# Patient Record
Sex: Female | Born: 1945 | Race: White | Hispanic: No | Marital: Married | Smoking: Never smoker
Health system: Southern US, Community
[De-identification: ages and names within clinical notes are randomized; demographics above are authoritative.]

## PROBLEM LIST (undated history)

## (undated) DIAGNOSIS — E876 Hypokalemia: Secondary | ICD-10-CM

## (undated) DIAGNOSIS — J45909 Unspecified asthma, uncomplicated: Secondary | ICD-10-CM

## (undated) DIAGNOSIS — I10 Essential (primary) hypertension: Secondary | ICD-10-CM

## (undated) HISTORY — PX: LEG SURGERY: SHX1003

## (undated) HISTORY — DX: Unspecified asthma, uncomplicated: J45.909

## (undated) HISTORY — DX: Essential (primary) hypertension: I10

## (undated) HISTORY — DX: Hypokalemia: E87.6

## (undated) HISTORY — PX: HYSTEROTOMY: SHX1776

## (undated) HISTORY — PX: TONSILLECTOMY: SUR1361

## (undated) HISTORY — PX: OVARIAN CYST REMOVAL: SHX89

## (undated) HISTORY — PX: CHOLECYSTECTOMY: SHX55

---

## 1998-08-05 ENCOUNTER — Other Ambulatory Visit: Admission: RE | Admit: 1998-08-05 | Discharge: 1998-08-05 | Payer: Self-pay | Admitting: Gynecology

## 1998-11-04 ENCOUNTER — Encounter: Payer: Self-pay | Admitting: General Surgery

## 1998-11-05 ENCOUNTER — Ambulatory Visit (HOSPITAL_COMMUNITY): Admission: RE | Admit: 1998-11-05 | Discharge: 1998-11-06 | Payer: Self-pay | Admitting: General Surgery

## 1998-11-05 ENCOUNTER — Encounter: Payer: Self-pay | Admitting: General Surgery

## 1999-02-08 ENCOUNTER — Encounter: Payer: Self-pay | Admitting: Emergency Medicine

## 1999-02-08 ENCOUNTER — Emergency Department (HOSPITAL_COMMUNITY): Admission: EM | Admit: 1999-02-08 | Discharge: 1999-02-08 | Payer: Self-pay | Admitting: Emergency Medicine

## 1999-09-24 ENCOUNTER — Encounter: Admission: RE | Admit: 1999-09-24 | Discharge: 1999-09-24 | Payer: Self-pay | Admitting: Gynecology

## 1999-09-24 ENCOUNTER — Encounter: Payer: Self-pay | Admitting: Gynecology

## 2000-06-30 ENCOUNTER — Other Ambulatory Visit: Admission: RE | Admit: 2000-06-30 | Discharge: 2000-06-30 | Payer: Self-pay | Admitting: Gynecology

## 2000-07-10 ENCOUNTER — Encounter: Payer: Self-pay | Admitting: Gynecology

## 2000-07-10 ENCOUNTER — Encounter: Admission: RE | Admit: 2000-07-10 | Discharge: 2000-07-10 | Payer: Self-pay | Admitting: Gynecology

## 2000-09-27 ENCOUNTER — Encounter: Admission: RE | Admit: 2000-09-27 | Discharge: 2000-09-27 | Payer: Self-pay | Admitting: Gynecology

## 2000-09-27 ENCOUNTER — Encounter: Payer: Self-pay | Admitting: Gynecology

## 2001-03-08 ENCOUNTER — Encounter: Payer: Self-pay | Admitting: Emergency Medicine

## 2001-03-08 ENCOUNTER — Emergency Department (HOSPITAL_COMMUNITY): Admission: EM | Admit: 2001-03-08 | Discharge: 2001-03-08 | Payer: Self-pay | Admitting: Emergency Medicine

## 2001-04-03 ENCOUNTER — Encounter: Payer: Self-pay | Admitting: Emergency Medicine

## 2001-04-03 ENCOUNTER — Emergency Department (HOSPITAL_COMMUNITY): Admission: EM | Admit: 2001-04-03 | Discharge: 2001-04-03 | Payer: Self-pay | Admitting: Emergency Medicine

## 2001-10-18 ENCOUNTER — Encounter: Admission: RE | Admit: 2001-10-18 | Discharge: 2001-10-18 | Payer: Self-pay | Admitting: Gynecology

## 2001-10-18 ENCOUNTER — Encounter: Payer: Self-pay | Admitting: Gynecology

## 2002-12-24 ENCOUNTER — Encounter: Payer: Self-pay | Admitting: Gynecology

## 2002-12-24 ENCOUNTER — Encounter: Admission: RE | Admit: 2002-12-24 | Discharge: 2002-12-24 | Payer: Self-pay | Admitting: Gynecology

## 2003-06-12 ENCOUNTER — Emergency Department (HOSPITAL_COMMUNITY): Admission: AD | Admit: 2003-06-12 | Discharge: 2003-06-13 | Payer: Self-pay | Admitting: Emergency Medicine

## 2004-03-02 ENCOUNTER — Encounter: Admission: RE | Admit: 2004-03-02 | Discharge: 2004-03-02 | Payer: Self-pay | Admitting: Gynecology

## 2004-04-16 ENCOUNTER — Other Ambulatory Visit: Admission: RE | Admit: 2004-04-16 | Discharge: 2004-04-16 | Payer: Self-pay | Admitting: Gynecology

## 2005-09-02 ENCOUNTER — Encounter: Admission: RE | Admit: 2005-09-02 | Discharge: 2005-09-02 | Payer: Self-pay | Admitting: Gynecology

## 2005-09-08 ENCOUNTER — Other Ambulatory Visit: Admission: RE | Admit: 2005-09-08 | Discharge: 2005-09-08 | Payer: Self-pay | Admitting: Gynecology

## 2006-11-21 ENCOUNTER — Emergency Department (HOSPITAL_COMMUNITY): Admission: EM | Admit: 2006-11-21 | Discharge: 2006-11-21 | Payer: Self-pay | Admitting: Emergency Medicine

## 2007-01-18 ENCOUNTER — Encounter: Admission: RE | Admit: 2007-01-18 | Discharge: 2007-01-18 | Payer: Self-pay | Admitting: Gynecology

## 2007-01-25 ENCOUNTER — Other Ambulatory Visit: Admission: RE | Admit: 2007-01-25 | Discharge: 2007-01-25 | Payer: Self-pay | Admitting: Gynecology

## 2008-02-13 ENCOUNTER — Encounter: Admission: RE | Admit: 2008-02-13 | Discharge: 2008-02-13 | Payer: Self-pay | Admitting: Gynecology

## 2009-03-17 ENCOUNTER — Encounter: Admission: RE | Admit: 2009-03-17 | Discharge: 2009-03-17 | Payer: Self-pay | Admitting: Gynecology

## 2010-04-09 ENCOUNTER — Encounter: Admission: RE | Admit: 2010-04-09 | Discharge: 2010-04-09 | Payer: Self-pay | Admitting: Gynecology

## 2010-05-30 ENCOUNTER — Encounter: Payer: Self-pay | Admitting: Gynecology

## 2010-09-01 ENCOUNTER — Other Ambulatory Visit: Payer: Self-pay | Admitting: Gynecology

## 2010-12-22 ENCOUNTER — Emergency Department (HOSPITAL_COMMUNITY)
Admission: EM | Admit: 2010-12-22 | Discharge: 2010-12-22 | Disposition: A | Discharge status: Home / Self Care | Payer: 59 | Attending: Emergency Medicine | Admitting: Emergency Medicine

## 2010-12-22 ENCOUNTER — Emergency Department (HOSPITAL_COMMUNITY): Payer: 59

## 2010-12-22 DIAGNOSIS — R42 Dizziness and giddiness: Secondary | ICD-10-CM | POA: Insufficient documentation

## 2010-12-22 DIAGNOSIS — W19XXXA Unspecified fall, initial encounter: Secondary | ICD-10-CM | POA: Insufficient documentation

## 2010-12-22 DIAGNOSIS — R55 Syncope and collapse: Secondary | ICD-10-CM | POA: Insufficient documentation

## 2010-12-22 DIAGNOSIS — K219 Gastro-esophageal reflux disease without esophagitis: Secondary | ICD-10-CM | POA: Insufficient documentation

## 2010-12-22 DIAGNOSIS — E876 Hypokalemia: Secondary | ICD-10-CM | POA: Insufficient documentation

## 2010-12-22 DIAGNOSIS — J45909 Unspecified asthma, uncomplicated: Secondary | ICD-10-CM | POA: Insufficient documentation

## 2010-12-22 DIAGNOSIS — S8010XA Contusion of unspecified lower leg, initial encounter: Secondary | ICD-10-CM | POA: Insufficient documentation

## 2010-12-22 DIAGNOSIS — Y9229 Other specified public building as the place of occurrence of the external cause: Secondary | ICD-10-CM | POA: Insufficient documentation

## 2010-12-22 DIAGNOSIS — I10 Essential (primary) hypertension: Secondary | ICD-10-CM | POA: Insufficient documentation

## 2010-12-22 LAB — DIFFERENTIAL
Basophils Absolute: 0.1 10*3/uL (ref 0.0–0.1)
Basophils Relative: 1 % (ref 0–1)
Eosinophils Absolute: 0.2 10*3/uL (ref 0.0–0.7)
Eosinophils Relative: 3 % (ref 0–5)
Lymphocytes Relative: 23 % (ref 12–46)
Lymphs Abs: 2.1 10*3/uL (ref 0.7–4.0)
Monocytes Absolute: 0.6 10*3/uL (ref 0.1–1.0)
Monocytes Relative: 7 % (ref 3–12)
Neutro Abs: 6.2 10*3/uL (ref 1.7–7.7)
Neutrophils Relative %: 67 % (ref 43–77)

## 2010-12-22 LAB — CBC
HCT: 39.8 % (ref 36.0–46.0)
Hemoglobin: 13.8 g/dL (ref 12.0–15.0)
MCH: 29.4 pg (ref 26.0–34.0)
MCHC: 34.7 g/dL (ref 30.0–36.0)
MCV: 84.9 fL (ref 78.0–100.0)
Platelets: 308 10*3/uL (ref 150–400)
RBC: 4.69 MIL/uL (ref 3.87–5.11)
RDW: 14.1 % (ref 11.5–15.5)
WBC: 9.2 10*3/uL (ref 4.0–10.5)

## 2010-12-22 LAB — POCT I-STAT, CHEM 8
BUN: 18 mg/dL (ref 6–23)
Calcium, Ion: 1.17 mmol/L (ref 1.12–1.32)
Chloride: 98 mEq/L (ref 96–112)
Creatinine, Ser: 1.3 mg/dL — ABNORMAL HIGH (ref 0.50–1.10)
Glucose, Bld: 109 mg/dL — ABNORMAL HIGH (ref 70–99)
HCT: 42 % (ref 36.0–46.0)
Hemoglobin: 14.3 g/dL (ref 12.0–15.0)
Potassium: 2.9 mEq/L — ABNORMAL LOW (ref 3.5–5.1)
Sodium: 137 mEq/L (ref 135–145)
TCO2: 28 mmol/L (ref 0–100)

## 2010-12-22 LAB — URINALYSIS, ROUTINE W REFLEX MICROSCOPIC
Bilirubin Urine: NEGATIVE
Glucose, UA: NEGATIVE mg/dL
Hgb urine dipstick: NEGATIVE
Ketones, ur: NEGATIVE mg/dL
Nitrite: NEGATIVE
Protein, ur: NEGATIVE mg/dL
Specific Gravity, Urine: 1.012 (ref 1.005–1.030)
Urobilinogen, UA: 0.2 mg/dL (ref 0.0–1.0)
pH: 7.5 (ref 5.0–8.0)

## 2010-12-22 LAB — URINE MICROSCOPIC-ADD ON

## 2010-12-22 LAB — POCT I-STAT TROPONIN I: Troponin i, poc: 0 ng/mL (ref 0.00–0.08)

## 2010-12-22 LAB — ETHANOL: Alcohol, Ethyl (B): <11 mg/dL (ref 0–11)

## 2010-12-22 LAB — POTASSIUM: Potassium: 3.2 mEq/L — ABNORMAL LOW (ref 3.5–5.1)

## 2011-02-21 LAB — POCT CARDIAC MARKERS
CKMB, poc: 1.1
CKMB, poc: <1 — ABNORMAL LOW
Myoglobin, poc: 85.6
Myoglobin, poc: 96.9
Operator id: 234501
Operator id: 234501
Troponin i, poc: <0.05
Troponin i, poc: <0.05

## 2011-02-21 LAB — I-STAT 8, (EC8 V) (CONVERTED LAB)
BUN: 12
Bicarbonate: 25.9 — ABNORMAL HIGH
Chloride: 101
Glucose, Bld: 93
HCT: 42
Hemoglobin: 14.3
Operator id: 234501
Potassium: 3.5
Sodium: 134 — ABNORMAL LOW
TCO2: 27
pCO2, Ven: 43.9 — ABNORMAL LOW
pH, Ven: 7.38 — ABNORMAL HIGH

## 2011-02-21 LAB — POCT I-STAT CREATININE
Creatinine, Ser: 1.2
Operator id: 234501

## 2011-02-21 LAB — D-DIMER, QUANTITATIVE: D-Dimer, Quant: 0.31

## 2011-05-19 ENCOUNTER — Other Ambulatory Visit: Payer: Self-pay | Admitting: Gynecology

## 2011-05-19 DIAGNOSIS — Z1231 Encounter for screening mammogram for malignant neoplasm of breast: Secondary | ICD-10-CM

## 2011-05-20 ENCOUNTER — Ambulatory Visit: Payer: 59

## 2011-05-26 ENCOUNTER — Ambulatory Visit: Payer: 59

## 2011-05-31 ENCOUNTER — Ambulatory Visit
Admission: RE | Admit: 2011-05-31 | Discharge: 2011-05-31 | Disposition: A | Discharge status: Home / Self Care | Payer: 59 | Source: Ambulatory Visit | Attending: Gynecology | Admitting: Gynecology

## 2011-05-31 DIAGNOSIS — Z1231 Encounter for screening mammogram for malignant neoplasm of breast: Secondary | ICD-10-CM

## 2011-06-02 ENCOUNTER — Ambulatory Visit: Payer: 59

## 2012-06-11 ENCOUNTER — Other Ambulatory Visit: Payer: Self-pay | Admitting: Gynecology

## 2012-06-11 DIAGNOSIS — Z1231 Encounter for screening mammogram for malignant neoplasm of breast: Secondary | ICD-10-CM

## 2012-06-12 ENCOUNTER — Ambulatory Visit: Payer: 59

## 2012-07-23 ENCOUNTER — Ambulatory Visit: Payer: 59

## 2012-07-25 ENCOUNTER — Ambulatory Visit: Payer: 59

## 2012-07-31 ENCOUNTER — Ambulatory Visit
Admission: RE | Admit: 2012-07-31 | Discharge: 2012-07-31 | Disposition: A | Discharge status: Home / Self Care | Payer: 59 | Source: Ambulatory Visit | Attending: Gynecology | Admitting: Gynecology

## 2012-07-31 DIAGNOSIS — Z1231 Encounter for screening mammogram for malignant neoplasm of breast: Secondary | ICD-10-CM

## 2012-08-02 ENCOUNTER — Ambulatory Visit: Payer: 59

## 2013-08-07 ENCOUNTER — Other Ambulatory Visit: Payer: Self-pay

## 2013-08-07 DIAGNOSIS — Z1231 Encounter for screening mammogram for malignant neoplasm of breast: Secondary | ICD-10-CM

## 2013-08-08 ENCOUNTER — Ambulatory Visit
Admission: RE | Admit: 2013-08-08 | Discharge: 2013-08-08 | Disposition: A | Discharge status: Home / Self Care | Payer: Medicare Other | Source: Ambulatory Visit

## 2013-08-08 DIAGNOSIS — Z1231 Encounter for screening mammogram for malignant neoplasm of breast: Secondary | ICD-10-CM

## 2013-08-13 ENCOUNTER — Other Ambulatory Visit: Payer: Self-pay | Admitting: Gynecology

## 2013-08-13 DIAGNOSIS — R928 Other abnormal and inconclusive findings on diagnostic imaging of breast: Secondary | ICD-10-CM

## 2013-08-15 ENCOUNTER — Ambulatory Visit
Admission: RE | Admit: 2013-08-15 | Discharge: 2013-08-15 | Disposition: A | Discharge status: Home / Self Care | Payer: Medicare Other | Source: Ambulatory Visit | Attending: Gynecology | Admitting: Gynecology

## 2013-08-15 ENCOUNTER — Ambulatory Visit
Admission: RE | Admit: 2013-08-15 | Discharge: 2013-08-15 | Disposition: A | Discharge status: Home / Self Care | Payer: Self-pay | Source: Ambulatory Visit | Attending: Gynecology | Admitting: Gynecology

## 2013-08-15 DIAGNOSIS — R928 Other abnormal and inconclusive findings on diagnostic imaging of breast: Secondary | ICD-10-CM

## 2013-08-21 ENCOUNTER — Other Ambulatory Visit: Payer: Medicare Other

## 2013-08-23 ENCOUNTER — Other Ambulatory Visit: Payer: Medicare Other

## 2013-09-06 ENCOUNTER — Ambulatory Visit: Payer: Self-pay | Admitting: Gynecology

## 2013-09-11 ENCOUNTER — Ambulatory Visit: Payer: Self-pay | Admitting: Gynecology

## 2013-10-14 ENCOUNTER — Ambulatory Visit: Payer: Self-pay | Admitting: Gynecology

## 2014-02-11 ENCOUNTER — Ambulatory Visit: Payer: Self-pay | Admitting: Gynecology

## 2014-04-08 ENCOUNTER — Ambulatory Visit: Payer: Self-pay | Admitting: Gynecology

## 2014-04-15 ENCOUNTER — Ambulatory Visit: Payer: Self-pay | Admitting: Gynecology

## 2014-05-16 ENCOUNTER — Ambulatory Visit: Payer: Self-pay | Admitting: Gynecology

## 2014-07-02 ENCOUNTER — Ambulatory Visit: Payer: Self-pay | Admitting: Gynecology

## 2014-10-01 ENCOUNTER — Other Ambulatory Visit: Payer: Self-pay

## 2014-10-01 DIAGNOSIS — Z1231 Encounter for screening mammogram for malignant neoplasm of breast: Secondary | ICD-10-CM

## 2014-10-29 ENCOUNTER — Ambulatory Visit
Admission: RE | Admit: 2014-10-29 | Discharge: 2014-10-29 | Disposition: A | Discharge status: Home / Self Care | Payer: Medicare Other | Source: Ambulatory Visit

## 2014-10-29 DIAGNOSIS — Z1231 Encounter for screening mammogram for malignant neoplasm of breast: Secondary | ICD-10-CM

## 2015-01-14 ENCOUNTER — Other Ambulatory Visit (HOSPITAL_COMMUNITY)
Admission: RE | Admit: 2015-01-14 | Discharge: 2015-01-14 | Disposition: A | Discharge status: Home / Self Care | Payer: Medicare Other | Source: Ambulatory Visit | Attending: Gynecology | Admitting: Gynecology

## 2015-01-14 ENCOUNTER — Ambulatory Visit (INDEPENDENT_AMBULATORY_CARE_PROVIDER_SITE_OTHER): Payer: Medicare Other | Admitting: Gynecology

## 2015-01-14 ENCOUNTER — Encounter: Payer: Self-pay | Admitting: Gynecology

## 2015-01-14 VITALS — BP 142/80 | Ht 63.0 in | Wt 156.0 lb

## 2015-01-14 DIAGNOSIS — N952 Postmenopausal atrophic vaginitis: Secondary | ICD-10-CM | POA: Diagnosis not present

## 2015-01-14 DIAGNOSIS — M810 Age-related osteoporosis without current pathological fracture: Secondary | ICD-10-CM

## 2015-01-14 DIAGNOSIS — Z124 Encounter for screening for malignant neoplasm of cervix: Secondary | ICD-10-CM | POA: Diagnosis present

## 2015-01-14 DIAGNOSIS — Z01419 Encounter for gynecological examination (general) (routine) without abnormal findings: Secondary | ICD-10-CM

## 2015-01-14 MED ORDER — ALENDRONATE SODIUM 70 MG PO TABS: 70.0000 mg | ORAL_TABLET | ORAL | Status: AC

## 2015-01-14 NOTE — Patient Instructions (Signed)
Start on the Fosamax as we discussed. Call me if you have any issues with this.  You may obtain a copy of any labs that were done today by logging onto MyChart as outlined in the instructions provided with your AVS (after visit summary). The office will not call with normal lab results but certainly if there are any significant abnormalities then we will contact you.   Health Maintenance Adopting a healthy lifestyle and getting preventive care can go a long way to promote health and wellness. Talk with your health care provider about what schedule of regular examinations is right for you. This is a good chance for you to check in with your provider about disease prevention and staying healthy. In between checkups, there are plenty of things you can do on your own. Experts have done a lot of research about which lifestyle changes and preventive measures are most likely to keep you healthy. Ask your health care provider for more information. WEIGHT AND DIET  Eat a healthy diet  Be sure to include plenty of vegetables, fruits, low-fat dairy products, and lean protein.  Do not eat a lot of foods high in solid fats, added sugars, or salt.  Get regular exercise. This is one of the most important things you can do for your health.  Most adults should exercise for at least 150 minutes each week. The exercise should increase your heart rate and make you sweat (moderate-intensity exercise).  Most adults should also do strengthening exercises at least twice a week. This is in addition to the moderate-intensity exercise.  Maintain a healthy weight  Body mass index (BMI) is a measurement that can be used to identify possible weight problems. It estimates body fat based on height and weight. Your health care provider can help determine your BMI and help you achieve or maintain a healthy weight.  For females 76 years of age and older:   A BMI below 18.5 is considered underweight.  A BMI of 18.5 to 24.9  is normal.  A BMI of 25 to 29.9 is considered overweight.  A BMI of 30 and above is considered obese.  Watch levels of cholesterol and blood lipids  You should start having your blood tested for lipids and cholesterol at 67 years of age, then have this test every 5 years.  You may need to have your cholesterol levels checked more often if:  Your lipid or cholesterol levels are high.  You are older than 67 years of age.  You are at high risk for heart disease.  CANCER SCREENING   Lung Cancer  Lung cancer screening is recommended for adults 52-78 years old who are at high risk for lung cancer because of a history of smoking.  A yearly low-dose CT scan of the lungs is recommended for people who:  Currently smoke.  Have quit within the past 15 years.  Have at least a 30-pack-year history of smoking. A pack year is smoking an average of one pack of cigarettes a day for 1 year.  Yearly screening should continue until it has been 15 years since you quit.  Yearly screening should stop if you develop a health problem that would prevent you from having lung cancer treatment.  Breast Cancer  Practice breast self-awareness. This means understanding how your breasts normally appear and feel.  It also means doing regular breast self-exams. Let your health care provider know about any changes, no matter how small.  If you are in your 72s  or 65s, you should have a clinical breast exam (CBE) by a health care provider every 1-3 years as part of a regular health exam.  If you are 88 or older, have a CBE every year. Also consider having a breast X-ray (mammogram) every year.  If you have a family history of breast cancer, talk to your health care provider about genetic screening.  If you are at high risk for breast cancer, talk to your health care provider about having an MRI and a mammogram every year.  Breast cancer gene (BRCA) assessment is recommended for women who have family  members with BRCA-related cancers. BRCA-related cancers include:  Breast.  Ovarian.  Tubal.  Peritoneal cancers.  Results of the assessment will determine the need for genetic counseling and BRCA1 and BRCA2 testing. Cervical Cancer Routine pelvic examinations to screen for cervical cancer are no longer recommended for nonpregnant women who are considered low risk for cancer of the pelvic organs (ovaries, uterus, and vagina) and who do not have symptoms. A pelvic examination may be necessary if you have symptoms including those associated with pelvic infections. Ask your health care provider if a screening pelvic exam is right for you.   The Pap test is the screening test for cervical cancer for women who are considered at risk.  If you had a hysterectomy for a problem that was not cancer or a condition that could lead to cancer, then you no longer need Pap tests.  If you are older than 65 years, and you have had normal Pap tests for the past 10 years, you no longer need to have Pap tests.  If you have had past treatment for cervical cancer or a condition that could lead to cancer, you need Pap tests and screening for cancer for at least 20 years after your treatment.  If you no longer get a Pap test, assess your risk factors if they change (such as having a new sexual partner). This can affect whether you should start being screened again.  Some women have medical problems that increase their chance of getting cervical cancer. If this is the case for you, your health care provider may recommend more frequent screening and Pap tests.  The human papillomavirus (HPV) test is another test that may be used for cervical cancer screening. The HPV test looks for the virus that can cause cell changes in the cervix. The cells collected during the Pap test can be tested for HPV.  The HPV test can be used to screen women 72 years of age and older. Getting tested for HPV can extend the interval  between normal Pap tests from three to five years.  An HPV test also should be used to screen women of any age who have unclear Pap test results.  After 67 years of age, women should have HPV testing as often as Pap tests.  Colorectal Cancer  This type of cancer can be detected and often prevented.  Routine colorectal cancer screening usually begins at 67 years of age and continues through 67 years of age.  Your health care provider may recommend screening at an earlier age if you have risk factors for colon cancer.  Your health care provider may also recommend using home test kits to check for hidden blood in the stool.  A small camera at the end of a tube can be used to examine your colon directly (sigmoidoscopy or colonoscopy). This is done to check for the earliest forms of colorectal cancer.  Routine screening usually begins at age 63.  Direct examination of the colon should be repeated every 5-10 years through 67 years of age. However, you may need to be screened more often if early forms of precancerous polyps or small growths are found. Skin Cancer  Check your skin from head to toe regularly.  Tell your health care provider about any new moles or changes in moles, especially if there is a change in a mole's shape or color.  Also tell your health care provider if you have a mole that is larger than the size of a pencil eraser.  Always use sunscreen. Apply sunscreen liberally and repeatedly throughout the day.  Protect yourself by wearing long sleeves, pants, a wide-brimmed hat, and sunglasses whenever you are outside. HEART DISEASE, DIABETES, AND HIGH BLOOD PRESSURE   Have your blood pressure checked at least every 1-2 years. High blood pressure causes heart disease and increases the risk of stroke.  If you are between 19 years and 40 years old, ask your health care provider if you should take aspirin to prevent strokes.  Have regular diabetes screenings. This involves  taking a blood sample to check your fasting blood sugar level.  If you are at a normal weight and have a low risk for diabetes, have this test once every three years after 67 years of age.  If you are overweight and have a high risk for diabetes, consider being tested at a younger age or more often. PREVENTING INFECTION  Hepatitis B  If you have a higher risk for hepatitis B, you should be screened for this virus. You are considered at high risk for hepatitis B if:  You were born in a country where hepatitis B is common. Ask your health care provider which countries are considered high risk.  Your parents were born in a high-risk country, and you have not been immunized against hepatitis B (hepatitis B vaccine).  You have HIV or AIDS.  You use needles to inject street drugs.  You live with someone who has hepatitis B.  You have had sex with someone who has hepatitis B.  You get hemodialysis treatment.  You take certain medicines for conditions, including cancer, organ transplantation, and autoimmune conditions. Hepatitis C  Blood testing is recommended for:  Everyone born from [birth year] through 1963.  Anyone with known risk factors for hepatitis C. Sexually transmitted infections (STIs)  You should be screened for sexually transmitted infections (STIs) including gonorrhea and chlamydia if:  You are sexually active and are younger than 66 years of age.  You are older than 67 years of age and your health care provider tells you that you are at risk for this type of infection.  Your sexual activity has changed since you were last screened and you are at an increased risk for chlamydia or gonorrhea. Ask your health care provider if you are at risk.  If you do not have HIV, but are at risk, it may be recommended that you take a prescription medicine daily to prevent HIV infection. This is called pre-exposure prophylaxis (PrEP). You are considered at risk if:  You are sexually active  and do not regularly use condoms or know the HIV status of your partner(s).  You take drugs by injection.  You are sexually active with a partner who has HIV. Talk with your health care provider about whether you are at high risk of being infected with HIV. If you choose to begin PrEP, you should first  be tested for HIV. You should then be tested every 3 months for as long as you are taking PrEP.  PREGNANCY   If you are premenopausal and you may become pregnant, ask your health care provider about preconception counseling.  If you may become pregnant, take 400 to 800 micrograms (mcg) of folic acid every day.  If you want to prevent pregnancy, talk to your health care provider about birth control (contraception). OSTEOPOROSIS AND MENOPAUSE   Osteoporosis is a disease in which the bones lose minerals and strength with aging. This can result in serious bone fractures. Your risk for osteoporosis can be identified using a bone density scan.  If you are 6 years of age or older, or if you are at risk for osteoporosis and fractures, ask your health care provider if you should be screened.  Ask your health care provider whether you should take a calcium or vitamin D supplement to lower your risk for osteoporosis.  Menopause may have certain physical symptoms and risks.  Hormone replacement therapy may reduce some of these symptoms and risks. Talk to your health care provider about whether hormone replacement therapy is right for you.  HOME CARE INSTRUCTIONS   Schedule regular health, dental, and eye exams.  Stay current with your immunizations.   Do not use any tobacco products including cigarettes, chewing tobacco, or electronic cigarettes.  If you are pregnant, do not drink alcohol.  If you are breastfeeding, limit how much and how often you drink alcohol.  Limit alcohol intake to no more than 1 drink per day for nonpregnant women. One drink equals 12 ounces of beer, 5 ounces of wine,  or 1 ounces of hard liquor.  Do not use street drugs.  Do not share needles.  Ask your health care provider for help if you need support or information about quitting drugs.  Tell your health care provider if you often feel depressed.  Tell your health care provider if you have ever been abused or do not feel safe at home. Document Released: 11/08/2010 Document Revised: 09/09/2013 Document Reviewed: 03/27/2013 [facility] Patient Information 2015 [place], [place]. This information is not intended to replace advice given to you by your health care provider. Make sure you discuss any questions you have with your health care provider.

## 2015-01-14 NOTE — Progress Notes (Signed)
Emily Sparks 12/09/1945 [id number]        69 y.o.  G2P0002 new patient for breast and pelvic exam. Former patient of Dr. Nicholas Lose. Several issues noted below.  Past medical history,surgical history, problem list, medications, allergies, family history and social history were all reviewed and documented as reviewed in the EPIC chart.  ROS:  Performed with pertinent positives and negatives included in the history, assessment and plan.   Additional significant findings :  none   Exam: [name] Vitals:   01/14/15 1426  BP: 142/80  Height:  (1.6 m)  Weight: 156 lb (70.761 kg)   General appearance:  Normal affect, orientation and appearance. Skin: Grossly normal HEENT: Without gross lesions.  No cervical or supraclavicular adenopathy. Thyroid normal.  Lungs:  With bilateral wheezing. No rales or rhonchi Cardiac: RR, without RMG Abdominal:  Soft, nontender, without masses, guarding, rebound, organomegaly or hernia Breasts:  Examined lying and sitting without masses, retractions, discharge or axillary adenopathy. Pelvic:  Ext/BUS/vagina with atrophic changes. Pap smear of cuff done  Adnexa  Without masses or tenderness    Anus and perineum  Normal   Rectovaginal  Normal sphincter tone without palpated masses or tenderness.    Assessment/Plan:  69 y.o. G8P0002 female for breast and pelvic exam.   1. Postmenopausal/atrophic genital changes. 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 Transiently used ERT for approximately 5 years and discontinued. Doing well without significant hot flushes, night sweats or vaginal dryness. Continue to monitor. 2. Osteoporosis. DEXA from 2002 shows T score -3.1. Apparently has never been treated medication wise and had declined treatment in the past. Uses intermittent steroids for her asthma. Reviewed her significant risk of fracture noting she did break her tibia stepping off a curb in the past. I reviewed my strong  recommendation she consider treatment now and options were reviewed with her. After a lengthy discussion she does want a trial of Fosamax 70 mg weekly. I reviewed how to take the medicines and the side effects/risks to include GERD osteonecrosis of the jaw atypical fractures. Prescription 1 year provided she is going to go ahead and start this. Baseline bone density ordered and she is going to schedule this now so we'll have something to compare to. 3. Mammography June 2016. Continue with annual mammography when due. SBE monthly reviewed. 4. Pap smear 2012. Pap smear of vaginal cuff today. No history of abnormal Pap smears previously. Options to stop screening on a regular basis given age and hysterectomy for benign indications discussed. Will readdress on an annual basis. 5. Colonoscopy 2013 by Dr. Kinnie Scales. Repeat at their recommended interval. 6. Health maintenance. Blood pressure 142/80 noted. Is being treated for hypertension and reports better blood pressures normally. No routine blood work done as this is done at her primary physician's office. Follow up in one year, sooner as needed.   Dara Lords MD, 3:13 PM 01/14/2015

## 2015-01-16 LAB — CYTOLOGY - PAP

## 2015-11-03 ENCOUNTER — Other Ambulatory Visit: Payer: Self-pay | Admitting: Internal Medicine

## 2015-11-03 DIAGNOSIS — Z1231 Encounter for screening mammogram for malignant neoplasm of breast: Secondary | ICD-10-CM

## 2015-11-17 ENCOUNTER — Ambulatory Visit
Admission: RE | Admit: 2015-11-17 | Discharge: 2015-11-17 | Disposition: A | Discharge status: Home / Self Care | Payer: Medicare Other | Source: Ambulatory Visit | Attending: Internal Medicine | Admitting: Internal Medicine

## 2015-11-17 DIAGNOSIS — Z1231 Encounter for screening mammogram for malignant neoplasm of breast: Secondary | ICD-10-CM

## 2016-02-23 DIAGNOSIS — K219 Gastro-esophageal reflux disease without esophagitis: Secondary | ICD-10-CM

## 2016-02-23 DIAGNOSIS — N289 Disorder of kidney and ureter, unspecified: Secondary | ICD-10-CM

## 2016-02-23 DIAGNOSIS — I10 Essential (primary) hypertension: Secondary | ICD-10-CM

## 2016-02-23 DIAGNOSIS — J40 Bronchitis, not specified as acute or chronic: Secondary | ICD-10-CM

## 2016-02-23 DIAGNOSIS — R0689 Other abnormalities of breathing: Secondary | ICD-10-CM

## 2016-02-23 DIAGNOSIS — J45902 Unspecified asthma with status asthmaticus: Secondary | ICD-10-CM | POA: Diagnosis not present

## 2016-02-23 DIAGNOSIS — J45901 Unspecified asthma with (acute) exacerbation: Secondary | ICD-10-CM

## 2016-02-24 DIAGNOSIS — J45902 Unspecified asthma with status asthmaticus: Secondary | ICD-10-CM | POA: Diagnosis not present

## 2016-02-24 DIAGNOSIS — J40 Bronchitis, not specified as acute or chronic: Secondary | ICD-10-CM | POA: Diagnosis not present

## 2016-02-24 DIAGNOSIS — R0689 Other abnormalities of breathing: Secondary | ICD-10-CM | POA: Diagnosis not present

## 2016-02-24 DIAGNOSIS — J45901 Unspecified asthma with (acute) exacerbation: Secondary | ICD-10-CM | POA: Diagnosis not present

## 2016-06-21 DIAGNOSIS — E871 Hypo-osmolality and hyponatremia: Secondary | ICD-10-CM

## 2016-06-21 DIAGNOSIS — J45901 Unspecified asthma with (acute) exacerbation: Secondary | ICD-10-CM

## 2016-06-21 DIAGNOSIS — E876 Hypokalemia: Secondary | ICD-10-CM | POA: Diagnosis not present

## 2016-06-21 DIAGNOSIS — R0689 Other abnormalities of breathing: Secondary | ICD-10-CM

## 2016-06-21 DIAGNOSIS — N179 Acute kidney failure, unspecified: Secondary | ICD-10-CM | POA: Diagnosis not present

## 2016-06-22 DIAGNOSIS — J45901 Unspecified asthma with (acute) exacerbation: Secondary | ICD-10-CM | POA: Diagnosis not present

## 2016-06-22 DIAGNOSIS — N179 Acute kidney failure, unspecified: Secondary | ICD-10-CM | POA: Diagnosis not present

## 2016-06-22 DIAGNOSIS — E876 Hypokalemia: Secondary | ICD-10-CM | POA: Diagnosis not present

## 2016-06-22 DIAGNOSIS — R0689 Other abnormalities of breathing: Secondary | ICD-10-CM | POA: Diagnosis not present

## 2016-06-23 DIAGNOSIS — R0689 Other abnormalities of breathing: Secondary | ICD-10-CM | POA: Diagnosis not present

## 2016-06-23 DIAGNOSIS — N179 Acute kidney failure, unspecified: Secondary | ICD-10-CM | POA: Diagnosis not present

## 2016-06-23 DIAGNOSIS — J45901 Unspecified asthma with (acute) exacerbation: Secondary | ICD-10-CM | POA: Diagnosis not present

## 2016-06-23 DIAGNOSIS — E876 Hypokalemia: Secondary | ICD-10-CM | POA: Diagnosis not present

## 2016-06-24 DIAGNOSIS — N179 Acute kidney failure, unspecified: Secondary | ICD-10-CM | POA: Diagnosis not present

## 2016-06-24 DIAGNOSIS — J45901 Unspecified asthma with (acute) exacerbation: Secondary | ICD-10-CM | POA: Diagnosis not present

## 2016-06-24 DIAGNOSIS — R0689 Other abnormalities of breathing: Secondary | ICD-10-CM | POA: Diagnosis not present

## 2016-06-24 DIAGNOSIS — E876 Hypokalemia: Secondary | ICD-10-CM | POA: Diagnosis not present

## 2016-10-13 DIAGNOSIS — I10 Essential (primary) hypertension: Secondary | ICD-10-CM | POA: Diagnosis not present

## 2016-10-13 DIAGNOSIS — J449 Chronic obstructive pulmonary disease, unspecified: Secondary | ICD-10-CM

## 2016-10-13 DIAGNOSIS — E876 Hypokalemia: Secondary | ICD-10-CM

## 2016-10-13 DIAGNOSIS — J440 Chronic obstructive pulmonary disease with acute lower respiratory infection: Secondary | ICD-10-CM | POA: Diagnosis not present

## 2016-10-13 DIAGNOSIS — K219 Gastro-esophageal reflux disease without esophagitis: Secondary | ICD-10-CM | POA: Diagnosis not present

## 2016-10-15 DIAGNOSIS — E876 Hypokalemia: Secondary | ICD-10-CM | POA: Diagnosis not present

## 2016-10-15 DIAGNOSIS — I10 Essential (primary) hypertension: Secondary | ICD-10-CM | POA: Diagnosis not present

## 2016-10-15 DIAGNOSIS — J440 Chronic obstructive pulmonary disease with acute lower respiratory infection: Secondary | ICD-10-CM | POA: Diagnosis not present

## 2016-10-15 DIAGNOSIS — J449 Chronic obstructive pulmonary disease, unspecified: Secondary | ICD-10-CM | POA: Diagnosis not present

## 2016-11-08 DIAGNOSIS — R0781 Pleurodynia: Secondary | ICD-10-CM

## 2016-11-08 DIAGNOSIS — J4541 Moderate persistent asthma with (acute) exacerbation: Secondary | ICD-10-CM

## 2016-11-08 DIAGNOSIS — I10 Essential (primary) hypertension: Secondary | ICD-10-CM

## 2016-11-08 DIAGNOSIS — R0902 Hypoxemia: Secondary | ICD-10-CM | POA: Diagnosis not present

## 2016-11-09 DIAGNOSIS — J4541 Moderate persistent asthma with (acute) exacerbation: Secondary | ICD-10-CM | POA: Diagnosis not present

## 2016-11-09 DIAGNOSIS — I10 Essential (primary) hypertension: Secondary | ICD-10-CM | POA: Diagnosis not present

## 2016-11-09 DIAGNOSIS — R0781 Pleurodynia: Secondary | ICD-10-CM | POA: Diagnosis not present

## 2016-11-09 DIAGNOSIS — R0902 Hypoxemia: Secondary | ICD-10-CM | POA: Diagnosis not present

## 2016-11-10 DIAGNOSIS — I10 Essential (primary) hypertension: Secondary | ICD-10-CM | POA: Diagnosis not present

## 2016-11-10 DIAGNOSIS — R0781 Pleurodynia: Secondary | ICD-10-CM | POA: Diagnosis not present

## 2016-11-10 DIAGNOSIS — R0902 Hypoxemia: Secondary | ICD-10-CM | POA: Diagnosis not present

## 2016-11-10 DIAGNOSIS — J4541 Moderate persistent asthma with (acute) exacerbation: Secondary | ICD-10-CM | POA: Diagnosis not present

## 2016-11-11 ENCOUNTER — Other Ambulatory Visit: Payer: Self-pay | Admitting: Gynecology

## 2016-11-11 DIAGNOSIS — Z1231 Encounter for screening mammogram for malignant neoplasm of breast: Secondary | ICD-10-CM

## 2016-11-17 ENCOUNTER — Ambulatory Visit
Admission: RE | Admit: 2016-11-17 | Discharge: 2016-11-17 | Disposition: A | Discharge status: Home / Self Care | Payer: Medicare Other | Source: Ambulatory Visit | Attending: Gynecology | Admitting: Gynecology

## 2016-11-17 DIAGNOSIS — Z1231 Encounter for screening mammogram for malignant neoplasm of breast: Secondary | ICD-10-CM

## 2016-12-09 ENCOUNTER — Telehealth: Payer: Self-pay | Admitting: *Deleted

## 2016-12-09 NOTE — Telephone Encounter (Signed)
Pt informed with normal mammogram

## 2017-03-08 DIAGNOSIS — J45901 Unspecified asthma with (acute) exacerbation: Secondary | ICD-10-CM

## 2017-03-08 DIAGNOSIS — R0602 Shortness of breath: Secondary | ICD-10-CM

## 2017-03-08 DIAGNOSIS — I10 Essential (primary) hypertension: Secondary | ICD-10-CM | POA: Diagnosis not present

## 2017-03-08 DIAGNOSIS — B9789 Other viral agents as the cause of diseases classified elsewhere: Secondary | ICD-10-CM

## 2017-03-08 DIAGNOSIS — E876 Hypokalemia: Secondary | ICD-10-CM | POA: Diagnosis not present

## 2017-03-08 DIAGNOSIS — J9601 Acute respiratory failure with hypoxia: Secondary | ICD-10-CM

## 2017-03-08 DIAGNOSIS — F419 Anxiety disorder, unspecified: Secondary | ICD-10-CM

## 2017-03-08 DIAGNOSIS — D72829 Elevated white blood cell count, unspecified: Secondary | ICD-10-CM | POA: Diagnosis not present

## 2017-03-09 DIAGNOSIS — F419 Anxiety disorder, unspecified: Secondary | ICD-10-CM | POA: Diagnosis not present

## 2017-03-09 DIAGNOSIS — D72829 Elevated white blood cell count, unspecified: Secondary | ICD-10-CM | POA: Diagnosis not present

## 2017-03-09 DIAGNOSIS — E876 Hypokalemia: Secondary | ICD-10-CM | POA: Diagnosis not present

## 2017-03-09 DIAGNOSIS — I10 Essential (primary) hypertension: Secondary | ICD-10-CM | POA: Diagnosis not present

## 2017-03-09 DIAGNOSIS — B9789 Other viral agents as the cause of diseases classified elsewhere: Secondary | ICD-10-CM | POA: Diagnosis not present

## 2017-03-09 DIAGNOSIS — R0602 Shortness of breath: Secondary | ICD-10-CM | POA: Diagnosis not present

## 2017-03-09 DIAGNOSIS — J9601 Acute respiratory failure with hypoxia: Secondary | ICD-10-CM | POA: Diagnosis not present

## 2017-03-09 DIAGNOSIS — J45901 Unspecified asthma with (acute) exacerbation: Secondary | ICD-10-CM | POA: Diagnosis not present

## 2017-03-10 DIAGNOSIS — J9601 Acute respiratory failure with hypoxia: Secondary | ICD-10-CM | POA: Diagnosis not present

## 2017-03-10 DIAGNOSIS — B9789 Other viral agents as the cause of diseases classified elsewhere: Secondary | ICD-10-CM | POA: Diagnosis not present

## 2017-03-10 DIAGNOSIS — R0602 Shortness of breath: Secondary | ICD-10-CM | POA: Diagnosis not present

## 2017-03-10 DIAGNOSIS — J45901 Unspecified asthma with (acute) exacerbation: Secondary | ICD-10-CM | POA: Diagnosis not present

## 2017-03-11 DIAGNOSIS — J45901 Unspecified asthma with (acute) exacerbation: Secondary | ICD-10-CM | POA: Diagnosis not present

## 2017-03-11 DIAGNOSIS — R0602 Shortness of breath: Secondary | ICD-10-CM | POA: Diagnosis not present

## 2017-03-11 DIAGNOSIS — B9789 Other viral agents as the cause of diseases classified elsewhere: Secondary | ICD-10-CM | POA: Diagnosis not present

## 2017-03-11 DIAGNOSIS — J9601 Acute respiratory failure with hypoxia: Secondary | ICD-10-CM | POA: Diagnosis not present

## 2017-04-05 DIAGNOSIS — J189 Pneumonia, unspecified organism: Secondary | ICD-10-CM

## 2017-04-05 DIAGNOSIS — R918 Other nonspecific abnormal finding of lung field: Secondary | ICD-10-CM

## 2017-04-05 DIAGNOSIS — I10 Essential (primary) hypertension: Secondary | ICD-10-CM | POA: Diagnosis not present

## 2017-04-05 DIAGNOSIS — J9600 Acute respiratory failure, unspecified whether with hypoxia or hypercapnia: Secondary | ICD-10-CM | POA: Diagnosis not present

## 2017-04-05 DIAGNOSIS — J45901 Unspecified asthma with (acute) exacerbation: Secondary | ICD-10-CM

## 2017-04-06 DIAGNOSIS — J189 Pneumonia, unspecified organism: Secondary | ICD-10-CM | POA: Diagnosis not present

## 2017-04-06 DIAGNOSIS — I10 Essential (primary) hypertension: Secondary | ICD-10-CM | POA: Diagnosis not present

## 2017-04-06 DIAGNOSIS — J9600 Acute respiratory failure, unspecified whether with hypoxia or hypercapnia: Secondary | ICD-10-CM | POA: Diagnosis not present

## 2017-04-06 DIAGNOSIS — K219 Gastro-esophageal reflux disease without esophagitis: Secondary | ICD-10-CM

## 2017-04-06 DIAGNOSIS — R918 Other nonspecific abnormal finding of lung field: Secondary | ICD-10-CM | POA: Diagnosis not present

## 2017-04-06 DIAGNOSIS — J45901 Unspecified asthma with (acute) exacerbation: Secondary | ICD-10-CM | POA: Diagnosis not present

## 2017-12-05 ENCOUNTER — Other Ambulatory Visit: Payer: Self-pay | Admitting: Gynecology

## 2017-12-05 DIAGNOSIS — Z1231 Encounter for screening mammogram for malignant neoplasm of breast: Secondary | ICD-10-CM

## 2017-12-27 ENCOUNTER — Ambulatory Visit
Admission: RE | Admit: 2017-12-27 | Discharge: 2017-12-27 | Disposition: A | Discharge status: Home / Self Care | Payer: Medicare Other | Source: Ambulatory Visit | Attending: Gynecology | Admitting: Gynecology

## 2017-12-27 DIAGNOSIS — Z1231 Encounter for screening mammogram for malignant neoplasm of breast: Secondary | ICD-10-CM

## 2017-12-28 ENCOUNTER — Ambulatory Visit: Payer: Medicare Other

## 2018-04-04 DIAGNOSIS — J40 Bronchitis, not specified as acute or chronic: Secondary | ICD-10-CM

## 2018-04-04 DIAGNOSIS — J9601 Acute respiratory failure with hypoxia: Secondary | ICD-10-CM

## 2018-04-04 DIAGNOSIS — J45901 Unspecified asthma with (acute) exacerbation: Secondary | ICD-10-CM

## 2018-04-04 DIAGNOSIS — E876 Hypokalemia: Secondary | ICD-10-CM

## 2018-04-04 DIAGNOSIS — I10 Essential (primary) hypertension: Secondary | ICD-10-CM

## 2018-04-05 DIAGNOSIS — J9601 Acute respiratory failure with hypoxia: Secondary | ICD-10-CM | POA: Diagnosis not present

## 2018-04-05 DIAGNOSIS — I10 Essential (primary) hypertension: Secondary | ICD-10-CM | POA: Diagnosis not present

## 2018-04-05 DIAGNOSIS — J40 Bronchitis, not specified as acute or chronic: Secondary | ICD-10-CM | POA: Diagnosis not present

## 2018-04-05 DIAGNOSIS — J45901 Unspecified asthma with (acute) exacerbation: Secondary | ICD-10-CM | POA: Diagnosis not present

## 2018-04-06 DIAGNOSIS — N179 Acute kidney failure, unspecified: Secondary | ICD-10-CM

## 2018-04-06 DIAGNOSIS — J45901 Unspecified asthma with (acute) exacerbation: Secondary | ICD-10-CM | POA: Diagnosis not present

## 2018-04-06 DIAGNOSIS — I10 Essential (primary) hypertension: Secondary | ICD-10-CM | POA: Diagnosis not present

## 2018-04-06 DIAGNOSIS — J9601 Acute respiratory failure with hypoxia: Secondary | ICD-10-CM | POA: Diagnosis not present

## 2018-04-07 DIAGNOSIS — J45901 Unspecified asthma with (acute) exacerbation: Secondary | ICD-10-CM | POA: Diagnosis not present

## 2018-04-07 DIAGNOSIS — N179 Acute kidney failure, unspecified: Secondary | ICD-10-CM | POA: Diagnosis not present

## 2018-04-07 DIAGNOSIS — I10 Essential (primary) hypertension: Secondary | ICD-10-CM | POA: Diagnosis not present

## 2018-04-07 DIAGNOSIS — J9601 Acute respiratory failure with hypoxia: Secondary | ICD-10-CM | POA: Diagnosis not present

## 2018-04-08 DIAGNOSIS — I10 Essential (primary) hypertension: Secondary | ICD-10-CM | POA: Diagnosis not present

## 2018-04-08 DIAGNOSIS — N179 Acute kidney failure, unspecified: Secondary | ICD-10-CM | POA: Diagnosis not present

## 2018-04-08 DIAGNOSIS — J45901 Unspecified asthma with (acute) exacerbation: Secondary | ICD-10-CM | POA: Diagnosis not present

## 2018-04-08 DIAGNOSIS — J9601 Acute respiratory failure with hypoxia: Secondary | ICD-10-CM | POA: Diagnosis not present

## 2018-05-05 DIAGNOSIS — E876 Hypokalemia: Secondary | ICD-10-CM | POA: Diagnosis not present

## 2018-05-05 DIAGNOSIS — J45901 Unspecified asthma with (acute) exacerbation: Secondary | ICD-10-CM | POA: Diagnosis not present

## 2018-05-05 DIAGNOSIS — R0600 Dyspnea, unspecified: Secondary | ICD-10-CM

## 2018-05-05 DIAGNOSIS — R0602 Shortness of breath: Secondary | ICD-10-CM

## 2018-05-05 DIAGNOSIS — I10 Essential (primary) hypertension: Secondary | ICD-10-CM | POA: Diagnosis not present

## 2018-05-06 DIAGNOSIS — E876 Hypokalemia: Secondary | ICD-10-CM | POA: Diagnosis not present

## 2018-05-06 DIAGNOSIS — I10 Essential (primary) hypertension: Secondary | ICD-10-CM | POA: Diagnosis not present

## 2018-05-06 DIAGNOSIS — R0600 Dyspnea, unspecified: Secondary | ICD-10-CM | POA: Diagnosis not present

## 2018-05-06 DIAGNOSIS — J45901 Unspecified asthma with (acute) exacerbation: Secondary | ICD-10-CM | POA: Diagnosis not present

## 2018-05-07 DIAGNOSIS — R0600 Dyspnea, unspecified: Secondary | ICD-10-CM | POA: Diagnosis not present

## 2018-05-07 DIAGNOSIS — I10 Essential (primary) hypertension: Secondary | ICD-10-CM | POA: Diagnosis not present

## 2018-05-07 DIAGNOSIS — E876 Hypokalemia: Secondary | ICD-10-CM | POA: Diagnosis not present

## 2018-05-07 DIAGNOSIS — J45901 Unspecified asthma with (acute) exacerbation: Secondary | ICD-10-CM | POA: Diagnosis not present

## 2018-05-08 DIAGNOSIS — J45901 Unspecified asthma with (acute) exacerbation: Secondary | ICD-10-CM | POA: Diagnosis not present

## 2018-05-08 DIAGNOSIS — R0600 Dyspnea, unspecified: Secondary | ICD-10-CM | POA: Diagnosis not present

## 2018-05-08 DIAGNOSIS — I10 Essential (primary) hypertension: Secondary | ICD-10-CM | POA: Diagnosis not present

## 2018-05-08 DIAGNOSIS — E876 Hypokalemia: Secondary | ICD-10-CM | POA: Diagnosis not present

## 2018-06-17 DIAGNOSIS — J45901 Unspecified asthma with (acute) exacerbation: Secondary | ICD-10-CM

## 2018-06-17 DIAGNOSIS — E871 Hypo-osmolality and hyponatremia: Secondary | ICD-10-CM

## 2018-06-17 DIAGNOSIS — I10 Essential (primary) hypertension: Secondary | ICD-10-CM

## 2018-06-17 DIAGNOSIS — R0902 Hypoxemia: Secondary | ICD-10-CM

## 2018-06-18 DIAGNOSIS — R0902 Hypoxemia: Secondary | ICD-10-CM | POA: Diagnosis not present

## 2018-06-18 DIAGNOSIS — E871 Hypo-osmolality and hyponatremia: Secondary | ICD-10-CM | POA: Diagnosis not present

## 2018-06-18 DIAGNOSIS — I10 Essential (primary) hypertension: Secondary | ICD-10-CM | POA: Diagnosis not present

## 2018-06-18 DIAGNOSIS — J45901 Unspecified asthma with (acute) exacerbation: Secondary | ICD-10-CM | POA: Diagnosis not present

## 2018-12-26 ENCOUNTER — Other Ambulatory Visit: Payer: Self-pay | Admitting: Obstetrics and Gynecology

## 2018-12-26 DIAGNOSIS — Z1231 Encounter for screening mammogram for malignant neoplasm of breast: Secondary | ICD-10-CM

## 2019-01-21 DIAGNOSIS — J4540 Moderate persistent asthma, uncomplicated: Secondary | ICD-10-CM

## 2019-01-21 DIAGNOSIS — R0789 Other chest pain: Secondary | ICD-10-CM

## 2019-01-21 DIAGNOSIS — R531 Weakness: Secondary | ICD-10-CM | POA: Diagnosis not present

## 2019-01-21 DIAGNOSIS — R791 Abnormal coagulation profile: Secondary | ICD-10-CM

## 2019-01-21 DIAGNOSIS — E871 Hypo-osmolality and hyponatremia: Secondary | ICD-10-CM

## 2019-01-21 DIAGNOSIS — R1112 Projectile vomiting: Secondary | ICD-10-CM

## 2019-01-21 DIAGNOSIS — K219 Gastro-esophageal reflux disease without esophagitis: Secondary | ICD-10-CM

## 2019-01-22 DIAGNOSIS — R531 Weakness: Secondary | ICD-10-CM | POA: Diagnosis not present

## 2019-01-22 DIAGNOSIS — R0789 Other chest pain: Secondary | ICD-10-CM | POA: Diagnosis not present

## 2019-01-22 DIAGNOSIS — R791 Abnormal coagulation profile: Secondary | ICD-10-CM | POA: Diagnosis not present

## 2019-01-22 DIAGNOSIS — E871 Hypo-osmolality and hyponatremia: Secondary | ICD-10-CM | POA: Diagnosis not present

## 2019-02-07 ENCOUNTER — Ambulatory Visit: Payer: Medicare Other

## 2019-02-13 ENCOUNTER — Encounter: Payer: Self-pay | Admitting: Gynecology

## 2019-02-28 ENCOUNTER — Ambulatory Visit
Admission: RE | Admit: 2019-02-28 | Discharge: 2019-02-28 | Disposition: A | Discharge status: Home / Self Care | Payer: Medicare Other | Source: Ambulatory Visit | Attending: Obstetrics and Gynecology | Admitting: Obstetrics and Gynecology

## 2019-02-28 ENCOUNTER — Other Ambulatory Visit: Payer: Self-pay

## 2019-02-28 DIAGNOSIS — Z1231 Encounter for screening mammogram for malignant neoplasm of breast: Secondary | ICD-10-CM

## 2019-04-09 ENCOUNTER — Ambulatory Visit: Payer: Medicare Other

## 2019-05-20 DIAGNOSIS — I10 Essential (primary) hypertension: Secondary | ICD-10-CM

## 2019-05-20 DIAGNOSIS — J45901 Unspecified asthma with (acute) exacerbation: Secondary | ICD-10-CM

## 2019-05-20 DIAGNOSIS — K219 Gastro-esophageal reflux disease without esophagitis: Secondary | ICD-10-CM

## 2019-05-20 DIAGNOSIS — J9601 Acute respiratory failure with hypoxia: Secondary | ICD-10-CM

## 2019-05-21 DIAGNOSIS — I10 Essential (primary) hypertension: Secondary | ICD-10-CM | POA: Diagnosis not present

## 2019-05-21 DIAGNOSIS — J9601 Acute respiratory failure with hypoxia: Secondary | ICD-10-CM | POA: Diagnosis not present

## 2019-05-21 DIAGNOSIS — J45901 Unspecified asthma with (acute) exacerbation: Secondary | ICD-10-CM | POA: Diagnosis not present

## 2019-05-21 DIAGNOSIS — K219 Gastro-esophageal reflux disease without esophagitis: Secondary | ICD-10-CM | POA: Diagnosis not present

## 2019-05-22 DIAGNOSIS — K219 Gastro-esophageal reflux disease without esophagitis: Secondary | ICD-10-CM | POA: Diagnosis not present

## 2019-05-22 DIAGNOSIS — J9601 Acute respiratory failure with hypoxia: Secondary | ICD-10-CM | POA: Diagnosis not present

## 2019-05-22 DIAGNOSIS — I10 Essential (primary) hypertension: Secondary | ICD-10-CM | POA: Diagnosis not present

## 2019-05-22 DIAGNOSIS — J45901 Unspecified asthma with (acute) exacerbation: Secondary | ICD-10-CM | POA: Diagnosis not present

## 2019-08-19 DIAGNOSIS — J9601 Acute respiratory failure with hypoxia: Secondary | ICD-10-CM

## 2019-08-20 DIAGNOSIS — J9601 Acute respiratory failure with hypoxia: Secondary | ICD-10-CM | POA: Diagnosis not present

## 2019-08-21 DIAGNOSIS — J9601 Acute respiratory failure with hypoxia: Secondary | ICD-10-CM | POA: Diagnosis not present

## 2019-08-22 DIAGNOSIS — J9601 Acute respiratory failure with hypoxia: Secondary | ICD-10-CM | POA: Diagnosis not present

## 2020-01-31 ENCOUNTER — Other Ambulatory Visit: Payer: Self-pay | Admitting: Obstetrics and Gynecology

## 2020-01-31 DIAGNOSIS — Z1231 Encounter for screening mammogram for malignant neoplasm of breast: Secondary | ICD-10-CM

## 2020-06-11 ENCOUNTER — Ambulatory Visit: Payer: Medicare Other

## 2020-07-22 ENCOUNTER — Ambulatory Visit: Payer: Medicare Other

## 2020-07-22 ENCOUNTER — Inpatient Hospital Stay: Admission: RE | Admit: 2020-07-22 | Payer: Medicare Other | Source: Ambulatory Visit

## 2020-07-30 ENCOUNTER — Ambulatory Visit
Admission: RE | Admit: 2020-07-30 | Discharge: 2020-07-30 | Disposition: A | Discharge status: Home / Self Care | Payer: Medicare Other | Source: Ambulatory Visit | Attending: Obstetrics and Gynecology | Admitting: Obstetrics and Gynecology

## 2020-07-30 ENCOUNTER — Other Ambulatory Visit: Payer: Self-pay

## 2020-07-30 DIAGNOSIS — Z1231 Encounter for screening mammogram for malignant neoplasm of breast: Secondary | ICD-10-CM

## 2021-03-31 DIAGNOSIS — J9601 Acute respiratory failure with hypoxia: Secondary | ICD-10-CM

## 2021-04-15 ENCOUNTER — Other Ambulatory Visit: Payer: Self-pay | Admitting: Internal Medicine

## 2021-04-15 DIAGNOSIS — Z1231 Encounter for screening mammogram for malignant neoplasm of breast: Secondary | ICD-10-CM

## 2021-08-03 ENCOUNTER — Ambulatory Visit: Payer: Medicare Other

## 2021-08-04 ENCOUNTER — Ambulatory Visit
Admission: RE | Admit: 2021-08-04 | Discharge: 2021-08-04 | Disposition: A | Discharge status: Home / Self Care | Payer: Medicare Other | Source: Ambulatory Visit | Attending: Internal Medicine | Admitting: Internal Medicine

## 2021-08-04 DIAGNOSIS — Z1231 Encounter for screening mammogram for malignant neoplasm of breast: Secondary | ICD-10-CM

## 2021-08-09 ENCOUNTER — Other Ambulatory Visit: Payer: Self-pay | Admitting: Internal Medicine

## 2021-08-09 DIAGNOSIS — E2839 Other primary ovarian failure: Secondary | ICD-10-CM

## 2022-01-25 ENCOUNTER — Other Ambulatory Visit: Payer: Self-pay | Admitting: Internal Medicine

## 2022-01-25 DIAGNOSIS — Z1231 Encounter for screening mammogram for malignant neoplasm of breast: Secondary | ICD-10-CM

## 2022-01-27 ENCOUNTER — Other Ambulatory Visit: Payer: Medicare Other

## 2022-08-10 ENCOUNTER — Other Ambulatory Visit: Payer: Medicare Other

## 2022-08-10 ENCOUNTER — Ambulatory Visit: Payer: Medicare Other

## 2022-09-18 IMAGING — MG MM DIGITAL SCREENING BILAT W/ TOMO AND CAD
8 series · 8 of 24 positions shown · non-contrast
Comparison: Previous exam(s).

CLINICAL DATA: Screening.

EXAM:
DIGITAL SCREENING BILATERAL MAMMOGRAM WITH TOMOSYNTHESIS AND CAD
TECHNIQUE: Bilateral screening digital craniocaudal and mediolateral oblique
mammograms were obtained. Bilateral screening digital breast
tomosynthesis was performed. The images were evaluated with
computer-aided detection.

[R MLO synth-2D]
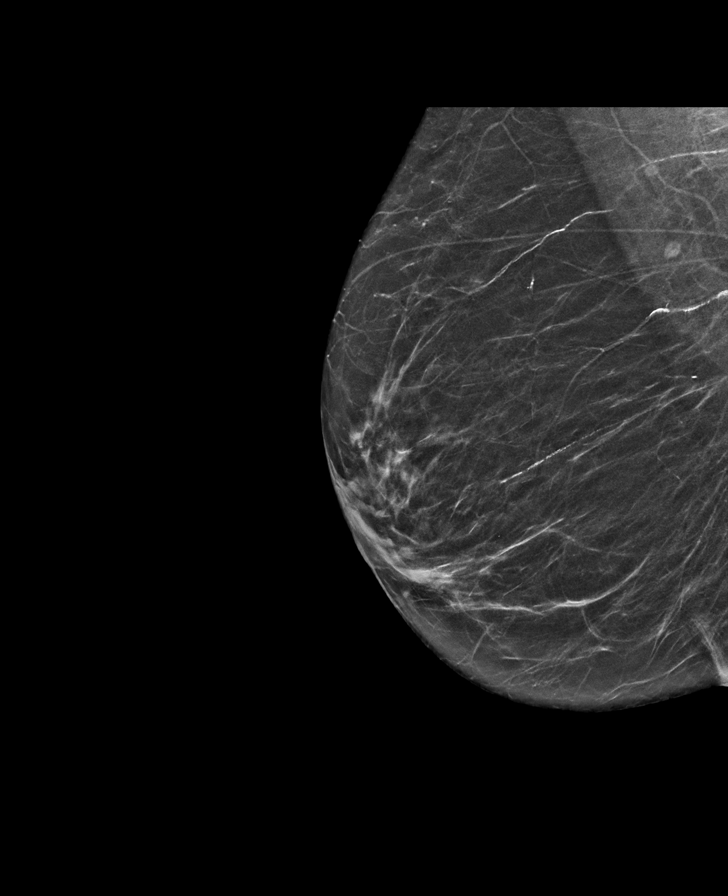

[L CC synth-2D]
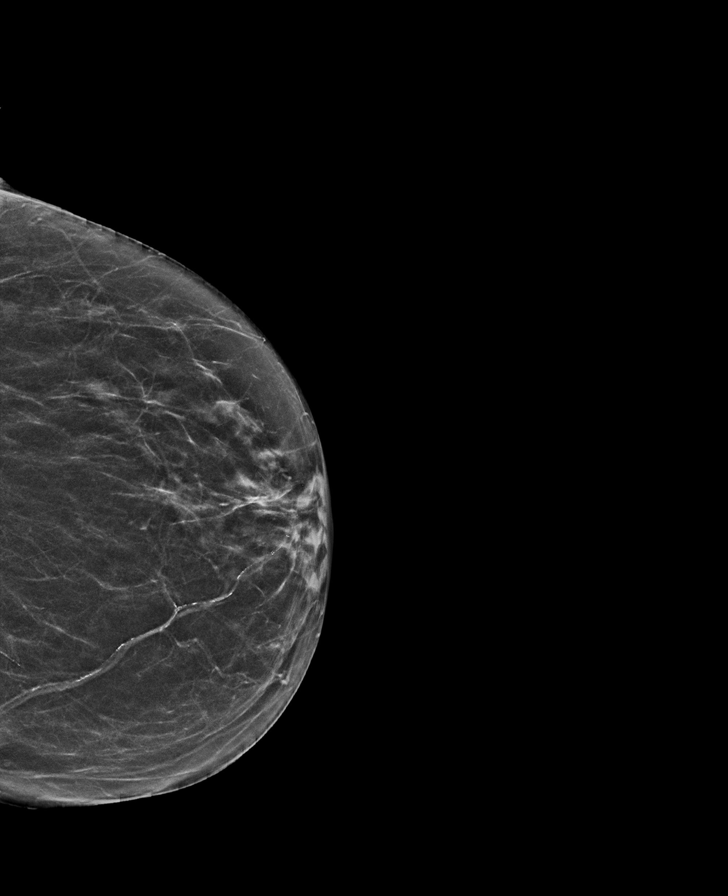

[L MLO synth-2D]
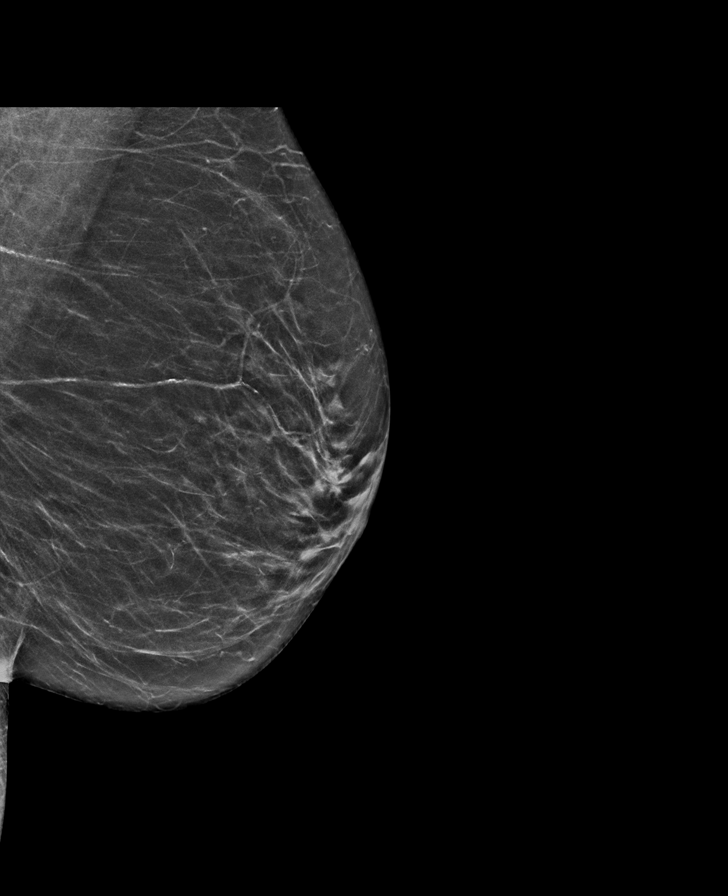

[R CC synth-2D]
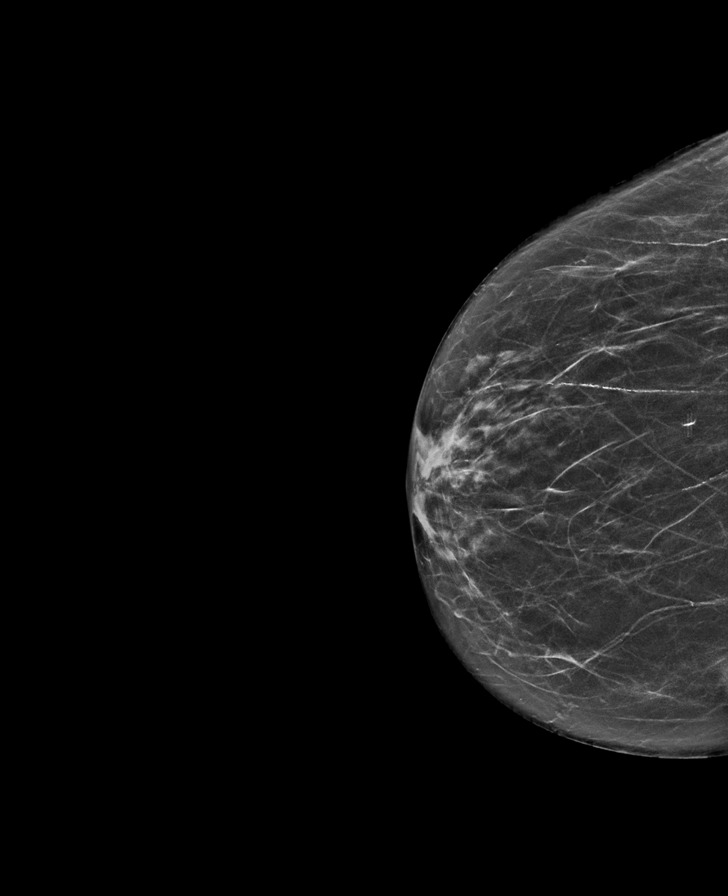

[R MLO tomo · tomo slice 29/57.0]
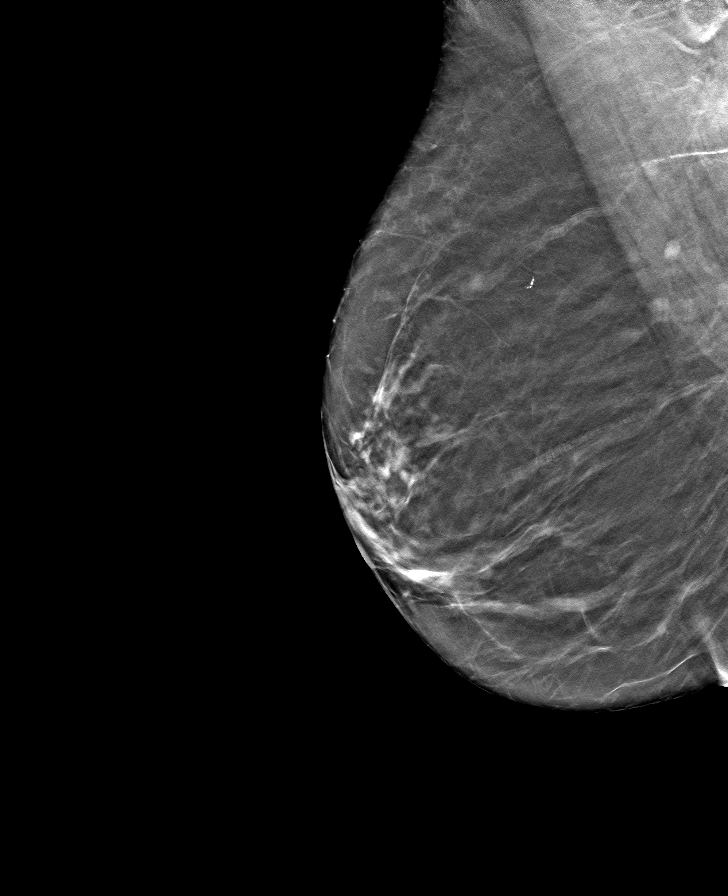

[R CC tomo · tomo slice 30/59.0]
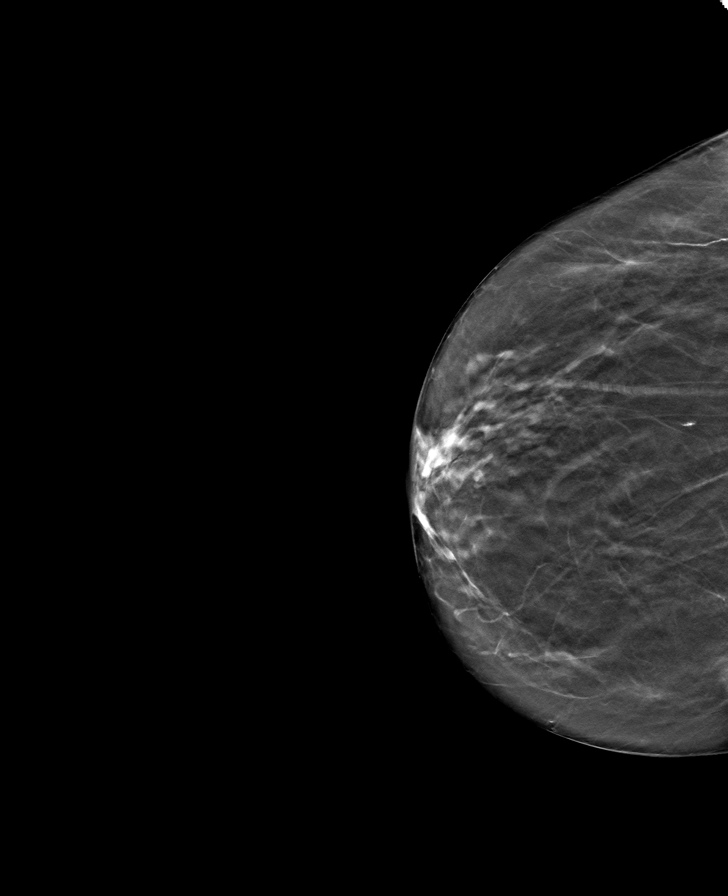

[L MLO tomo · tomo slice 31/60.0]
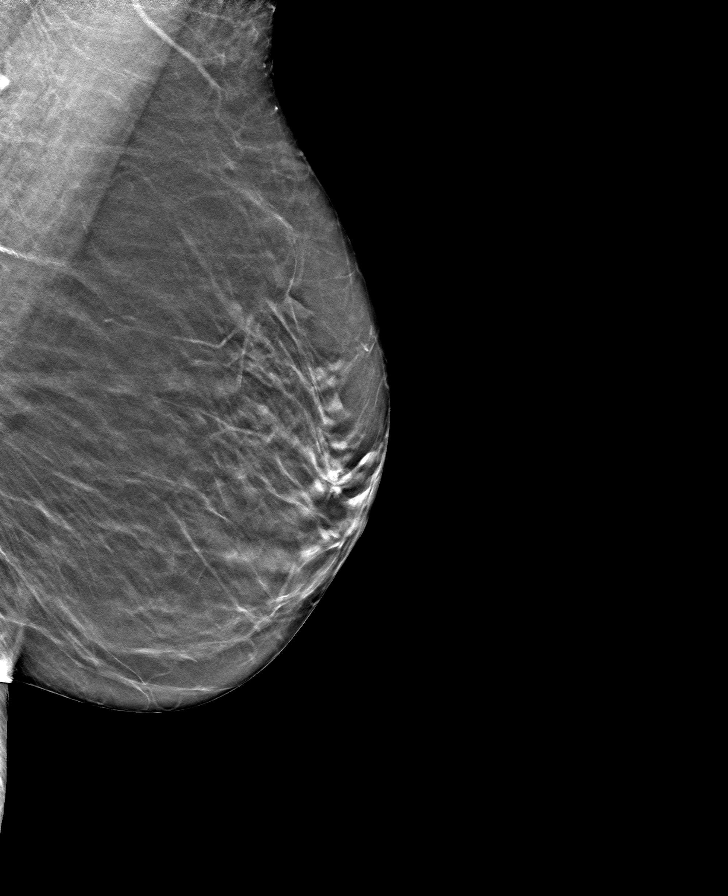

[L CC tomo · tomo slice 30/59.0]
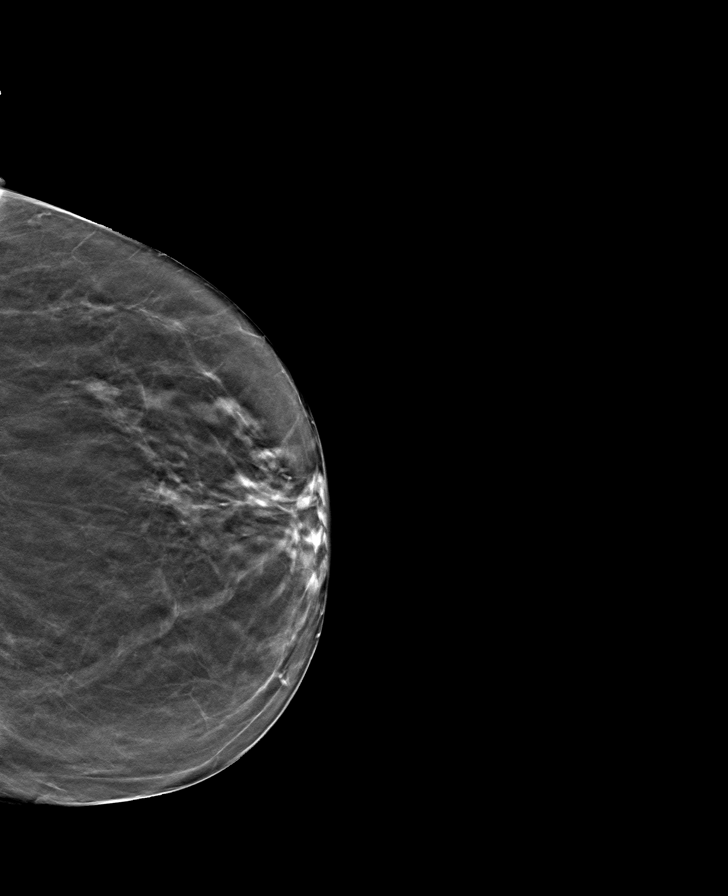

[8 of 24 positions shown; findings below may reference images not displayed]

ACR Breast Density Category c: The breast tissue is heterogeneously
dense, which may obscure small masses.
FINDINGS: There are no findings suspicious for malignancy.
IMPRESSION: No mammographic evidence of malignancy. A result letter of this
screening mammogram will be mailed directly to the patient.

RECOMMENDATION:
Screening mammogram in one year. (Code:[id number])

BI-RADS CATEGORY  1: Negative.

## 2022-09-21 ENCOUNTER — Ambulatory Visit
Admission: RE | Admit: 2022-09-21 | Discharge: 2022-09-21 | Disposition: A | Discharge status: Home / Self Care | Payer: Medicare Other | Source: Ambulatory Visit | Attending: Internal Medicine | Admitting: Internal Medicine

## 2022-09-21 DIAGNOSIS — Z1231 Encounter for screening mammogram for malignant neoplasm of breast: Secondary | ICD-10-CM

## 2023-09-26 ENCOUNTER — Other Ambulatory Visit: Payer: Self-pay | Admitting: Internal Medicine

## 2023-09-26 DIAGNOSIS — Z0000 Encounter for general adult medical examination without abnormal findings: Secondary | ICD-10-CM

## 2023-09-29 ENCOUNTER — Ambulatory Visit

## 2023-10-10 ENCOUNTER — Ambulatory Visit
Admission: RE | Admit: 2023-10-10 | Discharge: 2023-10-10 | Disposition: A | Discharge status: Home / Self Care | Source: Ambulatory Visit | Attending: Internal Medicine | Admitting: Internal Medicine

## 2023-10-10 DIAGNOSIS — Z0000 Encounter for general adult medical examination without abnormal findings: Secondary | ICD-10-CM
